# Patient Record
Sex: Female | Born: 1955 | Race: White | Hispanic: No | Marital: Married | Smoking: Never smoker
Health system: Southern US, Community
[De-identification: ages and names within clinical notes are randomized; demographics above are authoritative.]

---

## 1997-10-11 ENCOUNTER — Ambulatory Visit (HOSPITAL_COMMUNITY): Admission: RE | Admit: 1997-10-11 | Discharge: 1997-10-11 | Payer: Self-pay | Admitting: *Deleted

## 1997-10-11 ENCOUNTER — Emergency Department (HOSPITAL_COMMUNITY): Admission: EM | Admit: 1997-10-11 | Discharge: 1997-10-11 | Payer: Self-pay | Admitting: Emergency Medicine

## 1997-10-30 ENCOUNTER — Ambulatory Visit (HOSPITAL_COMMUNITY): Admission: RE | Admit: 1997-10-30 | Discharge: 1997-10-30 | Payer: Self-pay | Admitting: Neurological Surgery

## 1997-11-01 ENCOUNTER — Encounter: Admission: RE | Admit: 1997-11-01 | Discharge: 1998-01-30 | Payer: Self-pay | Admitting: Neurological Surgery

## 1998-02-28 ENCOUNTER — Encounter: Admission: RE | Admit: 1998-02-28 | Discharge: 1998-05-29 | Payer: Self-pay

## 1999-01-03 ENCOUNTER — Other Ambulatory Visit: Admission: RE | Admit: 1999-01-03 | Discharge: 1999-01-03 | Payer: Self-pay | Admitting: *Deleted

## 1999-10-06 ENCOUNTER — Encounter: Admission: RE | Admit: 1999-10-06 | Discharge: 2000-01-04 | Payer: Self-pay | Admitting: *Deleted

## 1999-10-18 ENCOUNTER — Ambulatory Visit (HOSPITAL_COMMUNITY): Admission: RE | Admit: 1999-10-18 | Discharge: 1999-10-18 | Payer: Self-pay | Admitting: *Deleted

## 1999-10-18 ENCOUNTER — Encounter: Payer: Self-pay | Admitting: *Deleted

## 2002-11-24 ENCOUNTER — Encounter: Admission: RE | Admit: 2002-11-24 | Discharge: 2002-11-24 | Payer: Self-pay | Admitting: Family Medicine

## 2002-11-24 ENCOUNTER — Encounter: Payer: Self-pay | Admitting: Family Medicine

## 2002-11-29 ENCOUNTER — Encounter: Admission: RE | Admit: 2002-11-29 | Discharge: 2002-11-29 | Payer: Self-pay | Admitting: Family Medicine

## 2002-11-29 ENCOUNTER — Encounter: Payer: Self-pay | Admitting: Family Medicine

## 2006-08-02 ENCOUNTER — Encounter (INDEPENDENT_AMBULATORY_CARE_PROVIDER_SITE_OTHER): Payer: Self-pay | Admitting: Cardiology

## 2006-08-02 ENCOUNTER — Ambulatory Visit (HOSPITAL_COMMUNITY): Admission: RE | Admit: 2006-08-02 | Discharge: 2006-08-02 | Payer: Self-pay | Admitting: Cardiology

## 2006-08-03 ENCOUNTER — Ambulatory Visit (HOSPITAL_COMMUNITY): Admission: RE | Admit: 2006-08-03 | Discharge: 2006-08-03 | Payer: Self-pay | Admitting: Cardiology

## 2007-01-06 ENCOUNTER — Other Ambulatory Visit: Admission: RE | Admit: 2007-01-06 | Discharge: 2007-01-06 | Payer: Self-pay | Admitting: Family Medicine

## 2007-01-25 ENCOUNTER — Encounter: Admission: RE | Admit: 2007-01-25 | Discharge: 2007-01-25 | Payer: Self-pay | Admitting: Family Medicine

## 2007-02-03 ENCOUNTER — Encounter: Admission: RE | Admit: 2007-02-03 | Discharge: 2007-02-03 | Payer: Self-pay | Admitting: Family Medicine

## 2007-08-26 ENCOUNTER — Emergency Department (HOSPITAL_COMMUNITY): Admission: EM | Admit: 2007-08-26 | Discharge: 2007-08-26 | Payer: Self-pay | Admitting: Emergency Medicine

## 2007-11-04 ENCOUNTER — Encounter: Admission: RE | Admit: 2007-11-04 | Discharge: 2007-11-04 | Payer: Self-pay | Admitting: Orthopedic Surgery

## 2008-02-22 ENCOUNTER — Encounter: Admission: RE | Admit: 2008-02-22 | Discharge: 2008-02-22 | Payer: Self-pay | Admitting: Family Medicine

## 2008-06-06 ENCOUNTER — Other Ambulatory Visit: Admission: RE | Admit: 2008-06-06 | Discharge: 2008-06-06 | Payer: Self-pay | Admitting: Family Medicine

## 2009-04-22 ENCOUNTER — Emergency Department (HOSPITAL_COMMUNITY): Admission: EM | Admit: 2009-04-22 | Discharge: 2009-04-22 | Payer: Self-pay | Admitting: Emergency Medicine

## 2010-05-29 ENCOUNTER — Encounter
Admission: RE | Admit: 2010-05-29 | Discharge: 2010-05-29 | Payer: Self-pay | Source: Home / Self Care | Admitting: Obstetrics and Gynecology

## 2010-07-12 ENCOUNTER — Encounter: Payer: Self-pay | Admitting: Obstetrics and Gynecology

## 2010-07-13 ENCOUNTER — Encounter: Payer: Self-pay | Admitting: Family Medicine

## 2011-06-19 ENCOUNTER — Other Ambulatory Visit: Payer: Self-pay | Admitting: Obstetrics and Gynecology

## 2011-06-19 DIAGNOSIS — Z1231 Encounter for screening mammogram for malignant neoplasm of breast: Secondary | ICD-10-CM

## 2011-07-17 ENCOUNTER — Ambulatory Visit: Payer: Self-pay

## 2012-06-09 ENCOUNTER — Ambulatory Visit: Payer: Self-pay

## 2012-08-19 ENCOUNTER — Other Ambulatory Visit: Payer: Self-pay

## 2012-08-19 DIAGNOSIS — Z1231 Encounter for screening mammogram for malignant neoplasm of breast: Secondary | ICD-10-CM

## 2012-09-15 ENCOUNTER — Ambulatory Visit
Admission: RE | Admit: 2012-09-15 | Discharge: 2012-09-15 | Disposition: A | Discharge status: Home / Self Care | Payer: Commercial Indemnity | Source: Ambulatory Visit

## 2012-09-15 DIAGNOSIS — Z1231 Encounter for screening mammogram for malignant neoplasm of breast: Secondary | ICD-10-CM

## 2012-09-16 ENCOUNTER — Other Ambulatory Visit: Payer: Self-pay | Admitting: Family Medicine

## 2012-09-16 DIAGNOSIS — R928 Other abnormal and inconclusive findings on diagnostic imaging of breast: Secondary | ICD-10-CM

## 2012-09-27 ENCOUNTER — Ambulatory Visit
Admission: RE | Admit: 2012-09-27 | Discharge: 2012-09-27 | Disposition: A | Discharge status: Home / Self Care | Payer: Managed Care, Other (non HMO) | Source: Ambulatory Visit | Attending: Family Medicine | Admitting: Family Medicine

## 2012-09-27 DIAGNOSIS — R928 Other abnormal and inconclusive findings on diagnostic imaging of breast: Secondary | ICD-10-CM

## 2014-03-06 ENCOUNTER — Emergency Department (HOSPITAL_BASED_OUTPATIENT_CLINIC_OR_DEPARTMENT_OTHER): Payer: BC Managed Care – PPO

## 2014-03-06 ENCOUNTER — Emergency Department (HOSPITAL_BASED_OUTPATIENT_CLINIC_OR_DEPARTMENT_OTHER)
Admission: EM | Admit: 2014-03-06 | Discharge: 2014-03-06 | Disposition: A | Discharge status: Home / Self Care | Payer: BC Managed Care – PPO | Attending: Emergency Medicine | Admitting: Emergency Medicine

## 2014-03-06 ENCOUNTER — Encounter (HOSPITAL_BASED_OUTPATIENT_CLINIC_OR_DEPARTMENT_OTHER): Payer: Self-pay | Admitting: Emergency Medicine

## 2014-03-06 DIAGNOSIS — K5909 Other constipation: Secondary | ICD-10-CM

## 2014-03-06 DIAGNOSIS — R300 Dysuria: Secondary | ICD-10-CM | POA: Diagnosis not present

## 2014-03-06 DIAGNOSIS — R1110 Vomiting, unspecified: Secondary | ICD-10-CM | POA: Insufficient documentation

## 2014-03-06 DIAGNOSIS — R1084 Generalized abdominal pain: Secondary | ICD-10-CM | POA: Diagnosis present

## 2014-03-06 DIAGNOSIS — N390 Urinary tract infection, site not specified: Secondary | ICD-10-CM

## 2014-03-06 DIAGNOSIS — K5900 Constipation, unspecified: Secondary | ICD-10-CM | POA: Diagnosis not present

## 2014-03-06 LAB — CBC WITH DIFFERENTIAL/PLATELET
Basophils Absolute: 0 10*3/uL (ref 0.0–0.1)
Basophils Relative: 0 % (ref 0–1)
EOS ABS: 0.1 10*3/uL (ref 0.0–0.7)
EOS PCT: 2 % (ref 0–5)
HCT: 39.7 % (ref 36.0–46.0)
Hemoglobin: 13.7 g/dL (ref 12.0–15.0)
LYMPHS ABS: 1.1 10*3/uL (ref 0.7–4.0)
LYMPHS PCT: 17 % (ref 12–46)
MCH: 29.7 pg (ref 26.0–34.0)
MCHC: 34.5 g/dL (ref 30.0–36.0)
MCV: 86.1 fL (ref 78.0–100.0)
MONO ABS: 0.5 10*3/uL (ref 0.1–1.0)
MONOS PCT: 8 % (ref 3–12)
NEUTROS PCT: 73 % (ref 43–77)
Neutro Abs: 5.1 10*3/uL (ref 1.7–7.7)
PLATELETS: 209 10*3/uL (ref 150–400)
RBC: 4.61 MIL/uL (ref 3.87–5.11)
RDW: 12.5 % (ref 11.5–15.5)
WBC: 6.9 10*3/uL (ref 4.0–10.5)

## 2014-03-06 LAB — URINALYSIS, ROUTINE W REFLEX MICROSCOPIC
BILIRUBIN URINE: NEGATIVE
Glucose, UA: NEGATIVE mg/dL
HGB URINE DIPSTICK: NEGATIVE
Ketones, ur: 15 mg/dL — AB
NITRITE: NEGATIVE
PH: 6.5 (ref 5.0–8.0)
PROTEIN: NEGATIVE mg/dL
Specific Gravity, Urine: 1.02 (ref 1.005–1.030)
Urobilinogen, UA: 0.2 mg/dL (ref 0.0–1.0)

## 2014-03-06 LAB — COMPREHENSIVE METABOLIC PANEL
ALK PHOS: 91 U/L (ref 39–117)
ALT: 28 U/L (ref 0–35)
ANION GAP: 10 (ref 5–15)
AST: 24 U/L (ref 0–37)
Albumin: 4.1 g/dL (ref 3.5–5.2)
BUN: 15 mg/dL (ref 6–23)
CALCIUM: 10.3 mg/dL (ref 8.4–10.5)
CO2: 29 mEq/L (ref 19–32)
Chloride: 101 mEq/L (ref 96–112)
Creatinine, Ser: 1 mg/dL (ref 0.50–1.10)
GFR calc Af Amer: 72 mL/min — ABNORMAL LOW (ref >90)
GFR calc non Af Amer: 62 mL/min — ABNORMAL LOW (ref >90)
Glucose, Bld: 104 mg/dL — ABNORMAL HIGH (ref 70–99)
Potassium: 3.8 mEq/L (ref 3.7–5.3)
SODIUM: 140 meq/L (ref 137–147)
Total Bilirubin: 1 mg/dL (ref 0.3–1.2)
Total Protein: 7.5 g/dL (ref 6.0–8.3)

## 2014-03-06 LAB — URINE MICROSCOPIC-ADD ON

## 2014-03-06 LAB — LIPASE, BLOOD: Lipase: 21 U/L (ref 11–59)

## 2014-03-06 MED ORDER — NITROFURANTOIN MONOHYD MACRO 100 MG PO CAPS
100.0000 mg | ORAL_CAPSULE | Freq: Once | ORAL | Status: AC
  Administered 2014-03-06: 100 mg via ORAL
  Filled 2014-03-06: qty 1

## 2014-03-06 MED ORDER — ONDANSETRON HCL 4 MG/2ML IJ SOLN
4.0000 mg | Freq: Once | INTRAMUSCULAR | Status: AC
  Administered 2014-03-06: 4 mg via INTRAVENOUS
  Filled 2014-03-06: qty 2

## 2014-03-06 MED ORDER — NITROFURANTOIN MONOHYD MACRO 100 MG PO CAPS: 100.0000 mg | ORAL_CAPSULE | Freq: Two times a day (BID) | ORAL | Status: AC

## 2014-03-06 MED ORDER — KETOROLAC TROMETHAMINE 30 MG/ML IJ SOLN
30.0000 mg | Freq: Once | INTRAMUSCULAR | Status: AC
  Administered 2014-03-06: 30 mg via INTRAVENOUS
  Filled 2014-03-06: qty 1

## 2014-03-06 MED ORDER — GI COCKTAIL ~~LOC~~
30.0000 mL | Freq: Once | ORAL | Status: DC
  Filled 2014-03-06: qty 30

## 2014-03-06 MED ORDER — ONDANSETRON 8 MG PO TBDP
8.0000 mg | ORAL_TABLET | Freq: Once | ORAL | Status: AC
  Administered 2014-03-06: 8 mg via ORAL
  Filled 2014-03-06: qty 1

## 2014-03-06 MED ORDER — DICYCLOMINE HCL 10 MG/ML IM SOLN
20.0000 mg | Freq: Once | INTRAMUSCULAR | Status: AC
  Administered 2014-03-06: 20 mg via INTRAMUSCULAR
  Filled 2014-03-06: qty 2

## 2014-03-06 NOTE — ED Notes (Signed)
Pt reports intermittent abdominal pain and pressure, vomited x 1, denies sob, dizziness, or back pain

## 2014-03-06 NOTE — ED Notes (Signed)
Patient upon d/c was slightly nauseated after sitting up too fast. MD aware  - orderes received and carried out. The patient felt better after interventions.

## 2014-03-06 NOTE — ED Provider Notes (Addendum)
CSN: [id number]     Arrival date & time 03/06/14  0119 History   First MD Initiated Contact with Patient 03/06/14 0133     Chief Complaint  Patient presents with  . Abdominal Pain     (Consider location/radiation/quality/duration/timing/severity/associated sxs/prior Treatment) Patient is a 58 y.o. female presenting with abdominal pain. The history is provided by the patient.  Abdominal Pain Pain location:  Generalized Pain quality: pressure   Pain quality: not bloating and not sharp   Pain quality comment:  Passing gas Pain radiates to:  Does not radiate Pain severity:  Severe Onset quality:  Gradual Duration:  1 day Timing:  Constant Progression:  Unchanged Chronicity:  New Context: not retching   Relieved by:  Nothing Worsened by:  Nothing tried Ineffective treatments:  Vomiting Associated symptoms: constipation, dysuria and vomiting   Associated symptoms: no chest pain and no fever   Vomiting:    Quality:  Stomach contents   Number of occurrences:  1   Severity:  Mild   Timing:  Rare   Progression:  Resolved Risk factors: no recent hospitalization   Patient states symptoms started about 12 pm Monday and have been ongoing.  Last food intake was 1 pm.  Patient states she has normal daily BM but went out of town over the weekend and her stool has been "different" and lessened since.  Perhaps dysuria a week ago but not today.  Felt so bad she forced herself to vomit and noted a red streak in it. No fevers,  No trauma.  Patient then recollected eating [employer] crackers this evening.  Took an essential peppermint oil capsule without relief of symptoms.    History reviewed. No pertinent past medical history. History reviewed. No pertinent past surgical history. History reviewed. No pertinent family history. History  Substance Use Topics  . Smoking status: Never Smoker   . Smokeless tobacco: Not on file  . Alcohol Use: No   OB History   Grav Para Term Preterm Abortions TAB SAB  Ect Mult Living                 Review of Systems  Constitutional: Negative for fever.  Cardiovascular: Negative for chest pain.  Gastrointestinal: Positive for vomiting, abdominal pain and constipation.  Genitourinary: Positive for dysuria.       A week ago  All other systems reviewed and are negative.     Allergies  Review of patient's allergies indicates no known allergies.  Home Medications   Prior to Admission medications   Not on File   BP 137/94  Pulse 74  Temp(Src) 98.7 F (37.1 C) (Oral)  Resp 20  Ht  (1.753 m)  Wt 195 lb (88.451 kg)  BMI 28.78 kg/m2  SpO2 98% Physical Exam  Constitutional: She is oriented to person, place, and time. She appears well-developed and well-nourished. No distress.  HENT:  Head: Normocephalic and atraumatic.  Mouth/Throat: Oropharynx is clear and moist.  Eyes: Conjunctivae are normal. Pupils are equal, round, and reactive to light.  Neck: Normal range of motion. Neck supple.  Cardiovascular: Normal rate, regular rhythm and intact distal pulses.   Pulmonary/Chest: Effort normal and breath sounds normal. No respiratory distress. She has no wheezes. She has no rales. She exhibits no tenderness.  Abdominal: Soft. Bowel sounds are increased. There is no tenderness. There is no rigidity, no rebound, no guarding, no tenderness at McBurney's point and negative Murphy's sign.  Musculoskeletal: Normal range of motion.  Neurological: She is  alert and oriented to person, place, and time. She has normal reflexes.  Skin: Skin is warm and dry.  Psychiatric: She has a normal mood and affect.    ED Course  Procedures (including critical care time) Labs Review Labs Reviewed  URINALYSIS, ROUTINE W REFLEX MICROSCOPIC - Abnormal; Notable for the following:    Color, Urine AMBER (*)    APPearance CLOUDY (*)    Ketones, ur 15 (*)    Leukocytes, UA MODERATE (*)    All other components within normal limits  COMPREHENSIVE METABOLIC PANEL -  Abnormal; Notable for the following:    Glucose, Bld 104 (*)    GFR calc non Af Amer 62 (*)    GFR calc Af Amer 72 (*)    All other components within normal limits  URINE MICROSCOPIC-ADD ON - Abnormal; Notable for the following:    Squamous Epithelial / LPF MANY (*)    Bacteria, UA MANY (*)    All other components within normal limits  CBC WITH DIFFERENTIAL  LIPASE, BLOOD    Imaging Review No results found.   EKG Interpretation None      MDM   Final diagnoses:  None  Labs and Xray results reviewed with patient.    No signs of perforated ulcer.  Suspect forcefulness of vomiting as patient reports "willing herself to vomit" caused blood streak, this was not hematemesis.  Do not believe symptoms are consistent with ulcer or or biliary colic.  WBCs normal, no LFT or lipase abnormalities.  No Murphy's sign nor pain at McBurneys point.  Exam vitals and labs are benign and reassuring.  Symptoms are most consistent with cramping and gas from same.    Pain relieved post medication, patient refused GI cocktail as she was pain free.  Will treat for both UTI and constipation.  Take all antibiotics as directed and begin daily miralax therapy.  Follow up with your PMD for recheck later this week.  Return for worsening pain, vomiting, oral or rectal bleeding fevers weakness or any concerns.       Jasmine Awe, MD 03/06/14 [phone]

## 2015-07-12 IMAGING — CR DG ABDOMEN ACUTE W/ 1V CHEST
3 series · 3 of 3 positions shown · non-contrast
Comparison: Chest radiograph performed 08/03/2006

CLINICAL DATA: Intermittent abdominal pain and abdominal pressure.
Vomiting.

EXAM:
ACUTE ABDOMEN SERIES (ABDOMEN 2 VIEW & CHEST 1 VIEW)

[w chest pa]
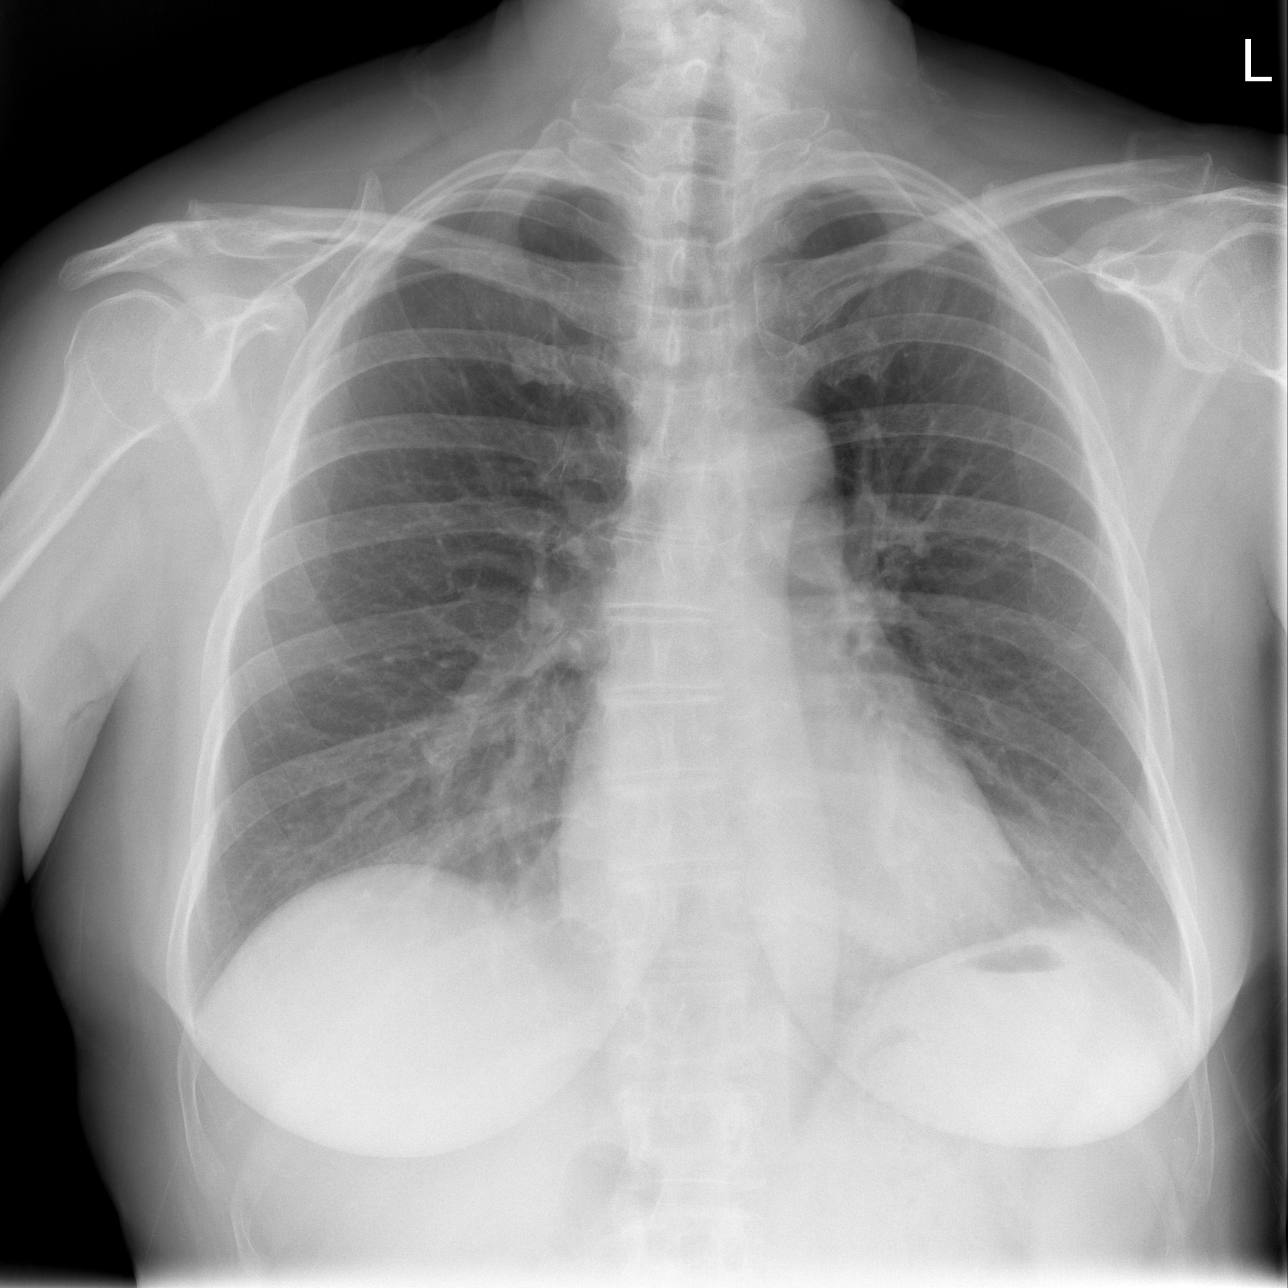

[w abdomen upright]
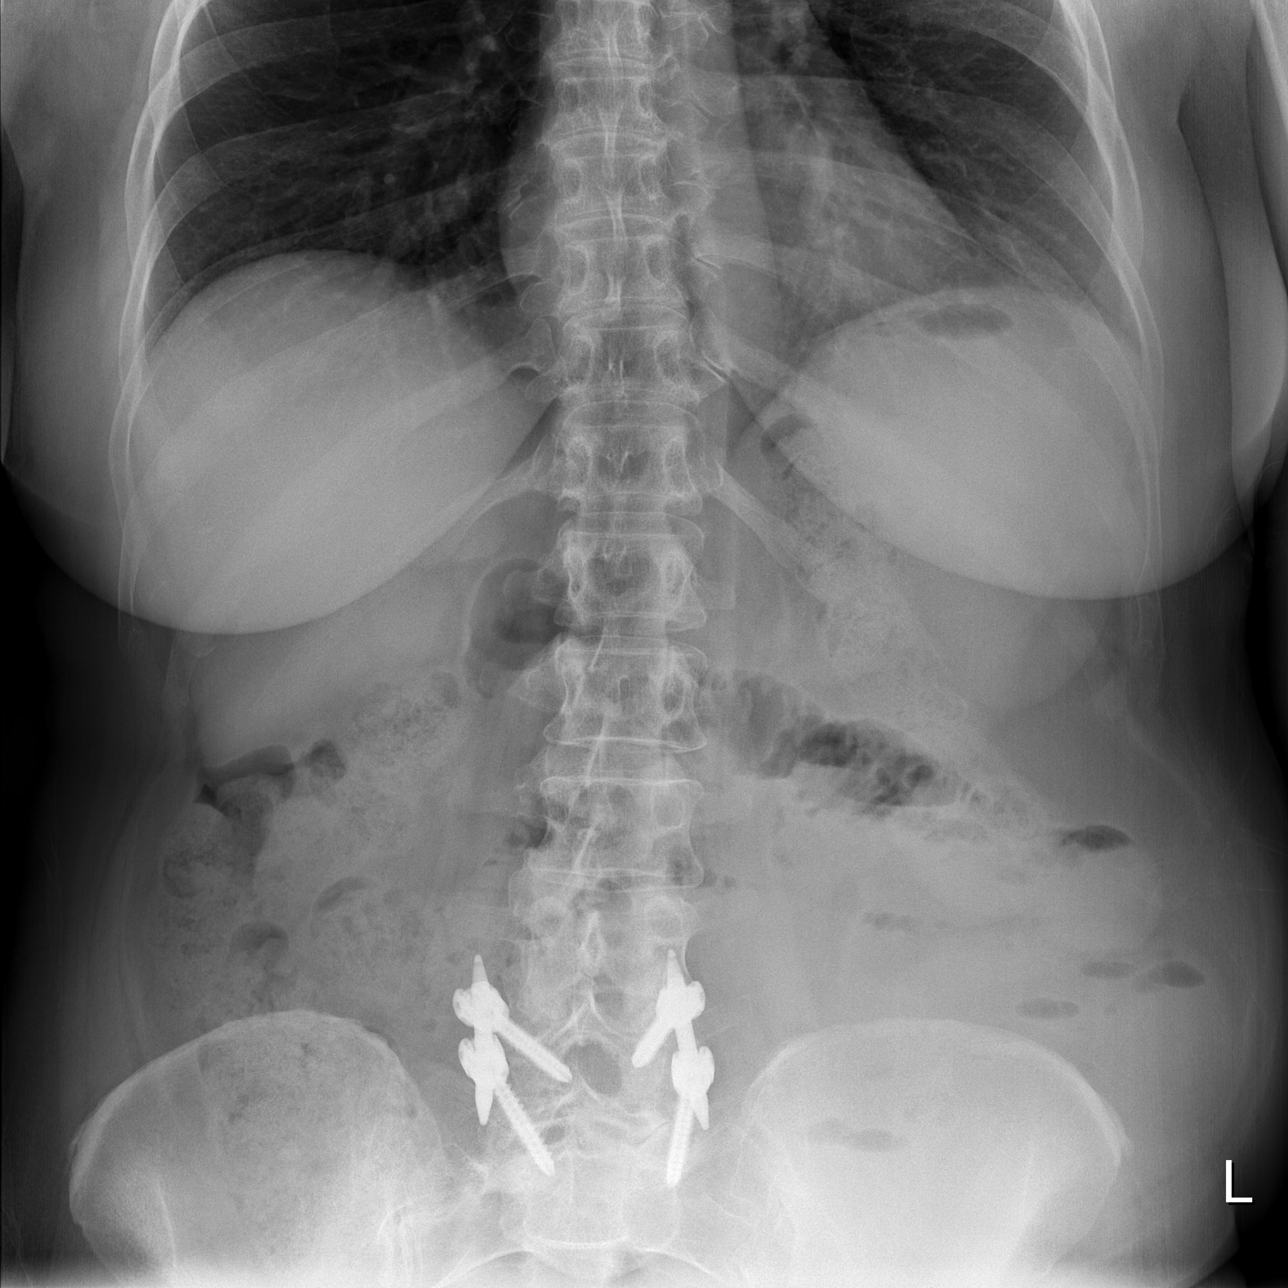

[t abdomen supine]
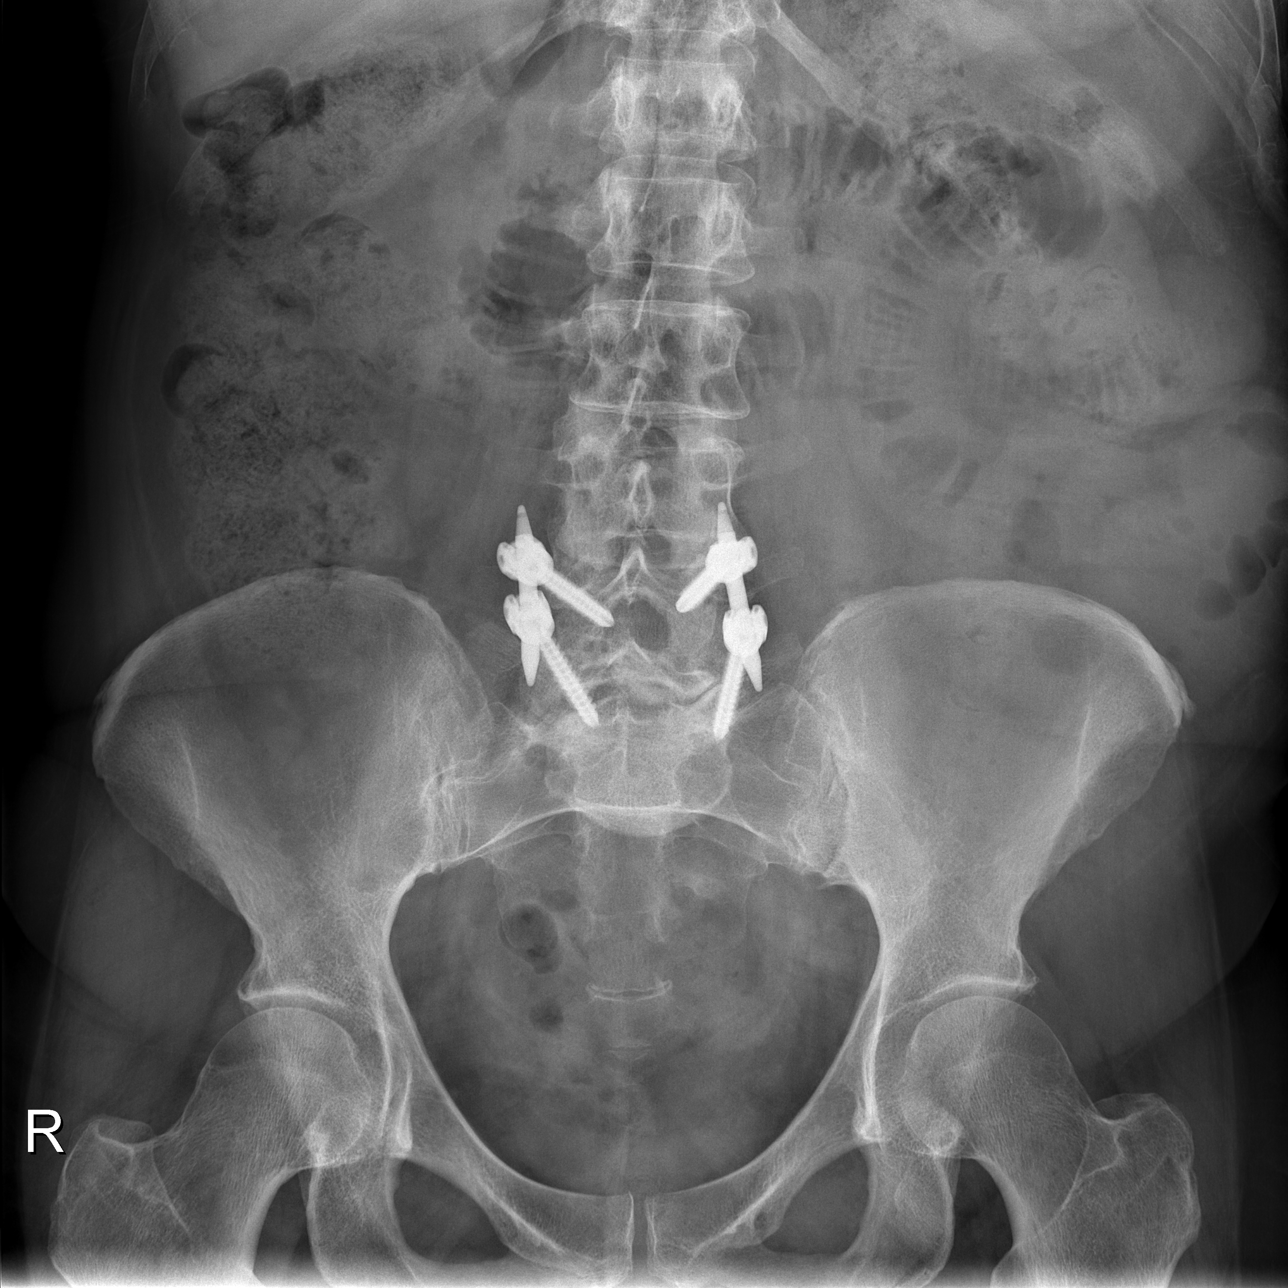

[3 of 3 positions shown; findings below may reference images not displayed]

FINDINGS: The lungs are well-aerated and clear. There is no evidence of focal
opacification, pleural effusion or pneumothorax. The
cardiomediastinal silhouette is within normal limits.

The visualized bowel gas pattern is unremarkable. Scattered stool
and air are seen within the colon; there is no evidence of small
bowel dilatation to suggest obstruction. No free intra-abdominal air
is identified on the provided upright view.

No acute osseous abnormalities are seen; the sacroiliac joints are
unremarkable in appearance. Lumbar spinal fusion hardware is noted
at L5-S1.
IMPRESSION: 1. Unremarkable bowel gas pattern; no free intra-abdominal air seen.
Small to moderate amount of stool noted in the colon.
2. No acute cardiopulmonary process identified.

## 2016-02-13 DIAGNOSIS — D2311 Other benign neoplasm of skin of right eyelid, including canthus: Secondary | ICD-10-CM | POA: Diagnosis not present

## 2016-06-05 DIAGNOSIS — E785 Hyperlipidemia, unspecified: Secondary | ICD-10-CM | POA: Diagnosis not present

## 2016-06-05 DIAGNOSIS — Z0000 Encounter for general adult medical examination without abnormal findings: Secondary | ICD-10-CM | POA: Diagnosis not present

## 2016-06-05 DIAGNOSIS — Z131 Encounter for screening for diabetes mellitus: Secondary | ICD-10-CM | POA: Diagnosis not present

## 2016-08-10 DIAGNOSIS — D2311 Other benign neoplasm of skin of right eyelid, including canthus: Secondary | ICD-10-CM | POA: Diagnosis not present

## 2017-06-11 DIAGNOSIS — Z0000 Encounter for general adult medical examination without abnormal findings: Secondary | ICD-10-CM | POA: Diagnosis not present

## 2017-06-11 DIAGNOSIS — Z131 Encounter for screening for diabetes mellitus: Secondary | ICD-10-CM | POA: Diagnosis not present

## 2017-06-11 DIAGNOSIS — E785 Hyperlipidemia, unspecified: Secondary | ICD-10-CM | POA: Diagnosis not present

## 2017-06-19 ENCOUNTER — Other Ambulatory Visit: Payer: Self-pay | Admitting: Physician Assistant

## 2017-06-19 DIAGNOSIS — Z1231 Encounter for screening mammogram for malignant neoplasm of breast: Secondary | ICD-10-CM

## 2018-06-17 DIAGNOSIS — E785 Hyperlipidemia, unspecified: Secondary | ICD-10-CM | POA: Diagnosis not present

## 2018-06-17 DIAGNOSIS — Z0000 Encounter for general adult medical examination without abnormal findings: Secondary | ICD-10-CM | POA: Diagnosis not present

## 2019-01-03 DIAGNOSIS — H00024 Hordeolum internum left upper eyelid: Secondary | ICD-10-CM | POA: Diagnosis not present

## 2019-01-03 DIAGNOSIS — H00021 Hordeolum internum right upper eyelid: Secondary | ICD-10-CM | POA: Diagnosis not present

## 2019-01-10 DIAGNOSIS — H00021 Hordeolum internum right upper eyelid: Secondary | ICD-10-CM | POA: Diagnosis not present

## 2019-06-05 ENCOUNTER — Other Ambulatory Visit: Payer: Self-pay | Admitting: Physician Assistant

## 2019-06-05 DIAGNOSIS — Z1231 Encounter for screening mammogram for malignant neoplasm of breast: Secondary | ICD-10-CM

## 2020-08-02 ENCOUNTER — Telehealth: Payer: Self-pay | Admitting: Specialist

## 2020-08-02 DIAGNOSIS — M25562 Pain in left knee: Secondary | ICD-10-CM | POA: Diagnosis not present

## 2020-08-02 NOTE — Telephone Encounter (Signed)
Received call from pt, wanting to check the date she had knee surgery with Dr. Otelia Sergeant. Looking is [facility], advised pt she had surgery on Right Knee 11/06/2008. That's all she needed. [phone]

## 2021-05-02 DIAGNOSIS — Z0001 Encounter for general adult medical examination with abnormal findings: Secondary | ICD-10-CM | POA: Diagnosis not present

## 2021-05-02 DIAGNOSIS — Z13220 Encounter for screening for lipoid disorders: Secondary | ICD-10-CM | POA: Diagnosis not present

## 2021-05-02 DIAGNOSIS — Z0000 Encounter for general adult medical examination without abnormal findings: Secondary | ICD-10-CM | POA: Diagnosis not present

## 2021-05-02 DIAGNOSIS — E559 Vitamin D deficiency, unspecified: Secondary | ICD-10-CM | POA: Diagnosis not present

## 2021-05-28 DIAGNOSIS — Z1211 Encounter for screening for malignant neoplasm of colon: Secondary | ICD-10-CM | POA: Diagnosis not present

## 2021-05-28 DIAGNOSIS — Z1212 Encounter for screening for malignant neoplasm of rectum: Secondary | ICD-10-CM | POA: Diagnosis not present

## 2022-03-31 DIAGNOSIS — J90 Pleural effusion, not elsewhere classified: Secondary | ICD-10-CM | POA: Diagnosis not present

## 2022-03-31 DIAGNOSIS — R5382 Chronic fatigue, unspecified: Secondary | ICD-10-CM | POA: Diagnosis not present

## 2022-03-31 DIAGNOSIS — J9811 Atelectasis: Secondary | ICD-10-CM | POA: Diagnosis not present

## 2022-03-31 DIAGNOSIS — R079 Chest pain, unspecified: Secondary | ICD-10-CM | POA: Diagnosis not present

## 2022-03-31 DIAGNOSIS — R0782 Intercostal pain: Secondary | ICD-10-CM | POA: Diagnosis not present

## 2022-04-02 DIAGNOSIS — J90 Pleural effusion, not elsewhere classified: Secondary | ICD-10-CM | POA: Diagnosis not present

## 2022-04-02 DIAGNOSIS — I2510 Atherosclerotic heart disease of native coronary artery without angina pectoris: Secondary | ICD-10-CM | POA: Diagnosis not present

## 2022-04-02 DIAGNOSIS — R0782 Intercostal pain: Secondary | ICD-10-CM | POA: Diagnosis not present

## 2022-04-03 DIAGNOSIS — J90 Pleural effusion, not elsewhere classified: Secondary | ICD-10-CM | POA: Diagnosis not present

## 2022-04-06 DIAGNOSIS — J90 Pleural effusion, not elsewhere classified: Secondary | ICD-10-CM | POA: Diagnosis not present

## 2022-04-06 DIAGNOSIS — R9389 Abnormal findings on diagnostic imaging of other specified body structures: Secondary | ICD-10-CM | POA: Diagnosis not present

## 2022-05-12 DIAGNOSIS — J90 Pleural effusion, not elsewhere classified: Secondary | ICD-10-CM | POA: Diagnosis not present

## 2022-05-12 DIAGNOSIS — J9811 Atelectasis: Secondary | ICD-10-CM | POA: Diagnosis not present

## 2022-05-12 DIAGNOSIS — R9389 Abnormal findings on diagnostic imaging of other specified body structures: Secondary | ICD-10-CM | POA: Diagnosis not present

## 2022-12-03 ENCOUNTER — Telehealth: Payer: Self-pay

## 2022-12-03 NOTE — Telephone Encounter (Signed)
Spoke to patient appointment scheduled with Dr.Schumann 12/11/22 at 9:20 am.She will have PCP fax EKG and office note done today.Directions given.

## 2022-12-11 ENCOUNTER — Encounter: Payer: Self-pay | Admitting: Cardiology

## 2022-12-11 ENCOUNTER — Ambulatory Visit: Payer: Medicare Other | Attending: Cardiology | Admitting: Cardiology

## 2022-12-11 VITALS — BP 115/72 | HR 66 | Ht 69.0 in | Wt 206.6 lb

## 2022-12-11 DIAGNOSIS — Z01818 Encounter for other preprocedural examination: Secondary | ICD-10-CM | POA: Insufficient documentation

## 2022-12-11 DIAGNOSIS — E669 Obesity, unspecified: Secondary | ICD-10-CM

## 2022-12-11 DIAGNOSIS — R9431 Abnormal electrocardiogram [ECG] [EKG]: Secondary | ICD-10-CM | POA: Diagnosis not present

## 2022-12-11 DIAGNOSIS — Z8249 Family history of ischemic heart disease and other diseases of the circulatory system: Secondary | ICD-10-CM | POA: Insufficient documentation

## 2022-12-11 DIAGNOSIS — Z13220 Encounter for screening for lipoid disorders: Secondary | ICD-10-CM | POA: Insufficient documentation

## 2022-12-11 NOTE — Progress Notes (Unsigned)
Cardiology Office Note:    Date:  12/14/2022   ID:  Alyssa Marshall, DOB Jul 16, 1955, MRN [id number]  PCP:  Pcp, No  Cardiologist:  Little Ishikawa, MD  Electrophysiologist:  None   Referring MD: Emily Filbert, MD   Chief Complaint  Patient presents with   Pre-op Exam    History of Present Illness:    Alyssa Marshall is a 67 y.o. female with a hx of hyperlipidemia who is referred by Dr. Mayford Knife for evaluation of abnormal EKG and preoperative evaluation.  Planning knee surgery.  Underwent EKG that showed left bundle branch block.  She denies any chest pain or shortness of breath.  States that she does not exercise because of knee pain.  She can walk up flight of stairs without stopping, denies any exertional symptoms.  She denies any recent lightheadedness, syncope, lower extremity edema, or palpitations.  She does have prior history of palpitations and had syncopal episodes though none in almost 20 years.  Previously had cardiac workup which she reports was negative.  She smoked briefly as a teenager.  Family history includes father had CABG in 39s and brother had CABG in 55s.   No past medical history on file.  No past surgical history on file.  Current Medications: No outpatient medications have been marked as taking for the 12/11/22 encounter (Office Visit) with Little Ishikawa, MD.     Allergies:   Patient has no known allergies.   Social History   Socioeconomic History   Marital status: Married    Spouse name: Not on file   Number of children: Not on file   Years of education: Not on file   Highest education level: Not on file  Occupational History   Not on file  Tobacco Use   Smoking status: Never   Smokeless tobacco: Not on file  Substance and Sexual Activity   Alcohol use: No   Drug use: Not on file   Sexual activity: Not on file  Other Topics Concern   Not on file  Social History Narrative   Not on file   Social Determinants of Health    Financial Resource Strain: Not on file  Food Insecurity: Not on file  Transportation Needs: Not on file  Physical Activity: Not on file  Stress: Not on file  Social Connections: Not on file     Family History: Family history includes father had CABG in 46s and brother had CABG in 35s.  ROS:   Please see the history of present illness.     All other systems reviewed and are negative.  EKGs/Labs/Other Studies Reviewed:    The following studies were reviewed today:   EKG:   12/11/2022: Normal sinus rhythm, low voltage, rate 66, less than 1 mm ST depression in inferior leads and V3-6 along with T wave inversions. 12/03/2022: Left bundle branch block, rate 65, normal sinus rhythm  Recent Labs: No results found for requested labs within last 365 days.  Recent Lipid Panel No results found for: "CHOL", "TRIG", "HDL", "CHOLHDL", "VLDL", "LDLCALC", "LDLDIRECT"  Physical Exam:    VS:  BP 115/72   Pulse 66   Ht 5\' 9"  (1.753 m)   Wt 206 lb 9.6 oz (93.7 kg)   SpO2 98%   BMI 30.51 kg/m     Wt Readings from Last 3 Encounters:  12/11/22 206 lb 9.6 oz (93.7 kg)  03/06/14 195 lb (88.5 kg)     GEN:  Well nourished, well  developed in no acute distress HEENT: Normal NECK: No JVD; No carotid bruits LYMPHATICS: No lymphadenopathy CARDIAC: RRR, no murmurs, rubs, gallops RESPIRATORY:  Clear to auscultation without rales, wheezing or rhonchi  ABDOMEN: Soft, non-tender, non-distended MUSCULOSKELETAL:  No edema; No deformity  SKIN: Warm and dry NEUROLOGIC:  Alert and oriented x 3 PSYCHIATRIC:  Normal affect   ASSESSMENT:    1. Pre-op evaluation   2. Family history of cardiovascular disease   3. Abnormal EKG   4. Lipid screening    PLAN:    Abnormal EKG: Left bundle branch block on EKG 12/03/2022.  Not present on EKG today.  Check echocardiogram to evaluate for structural heart disease  Preop evaluation: Prior to knee surgery.  Functional capacity has been limited due to knee  pain, but appears greater than 4 METS and she denies any exertional symptoms.  RCRI score 0.  Overall classify as low risk for an intermediate risk surgery.  Planning echocardiogram given abnormal EKG as above.  If echo unremarkable, no further cardiac workup recommended  Family history of CAD: Both father and brother underwent CABG.  Check calcium score.  Check lipid panel  RTC in 6 months   Medication Adjustments/Labs and Tests Ordered: Current medicines are reviewed at length with the patient today.  Concerns regarding medicines are outlined above.  Orders Placed This Encounter  Procedures   CT CARDIAC SCORING (SELF PAY ONLY)   Lipid panel   EKG 12-Lead   ECHOCARDIOGRAM COMPLETE   No orders of the defined types were placed in this encounter.   Patient Instructions  Medication Instructions:   No changes  *If you need a refill on your cardiac medications before your next appointment, please call your pharmacy*   Lab Work: fasting Lipid  If you have labs (blood work) drawn today and your tests are completely normal, you will receive your results only by: MyChart Message (if you have MyChart) OR A paper copy in the mail If you have any lab test that is abnormal or we need to change your treatment, we will call you to review the results.   Testing/Procedures: Will be schedule at [address] Your physician has requested that you have an echocardiogram. Echocardiography is a painless test that uses sound waves to create images of your heart. It provides your doctor with information about the size and shape of your heart and how well your heart's chambers and valves are working. This procedure takes approximately one hour. There are no restrictions for this procedure. Please do NOT wear cologne, perfume, aftershave, or lotions (deodorant is allowed). Please arrive 15 minutes prior to your appointment time.  And  CT coronary calcium score.   Test  locations:  [facility]  [facility] at [facility]  This is $99 out of pocket.   Coronary CalciumScan A coronary calcium scan is an imaging test used to look for deposits of calcium and other fatty materials (plaques) in the inner lining of the blood vessels of the heart (coronary arteries). These deposits of calcium and plaques can partly clog and narrow the coronary arteries without producing any symptoms or warning signs. This puts a person at risk for a heart attack. This test can detect these deposits before symptoms develop. Tell a health care provider about: Any allergies you have. All medicines you are taking, including vitamins, herbs, eye drops, creams, and over-the-counter medicines. Any problems you or family members  have had with anesthetic medicines. Any blood disorders you have. Any surgeries you have had. Any medical conditions you have. Whether you are pregnant or may be pregnant. What are the risks? Generally, this is a safe procedure. However, problems may occur, including: Harm to a pregnant woman and her unborn baby. This test involves the use of radiation. Radiation exposure can be dangerous to a pregnant woman and her unborn baby. If you are pregnant, you generally should not have this procedure done. Slight increase in the risk of cancer. This is because of the radiation involved in the test. What happens before the procedure? No preparation is needed for this procedure. What happens during the procedure? You will undress and remove any jewelry around your neck or chest. You will put on a hospital gown. Sticky electrodes will be placed on your chest. The electrodes will be connected to an electrocardiogram (ECG) machine to record a tracing of the electrical activity of your heart. A CT scanner will take pictures of your heart. During this time, you will be asked to lie still and hold your  breath for 2-3 seconds while a picture of your heart is being taken. The procedure may vary among health care providers and hospitals. What happens after the procedure? You can get dressed. You can return to your normal activities. It is up to you to get the results of your test. Ask your health care provider, or the department that is doing the test, when your results will be ready. Summary A coronary calcium scan is an imaging test used to look for deposits of calcium and other fatty materials (plaques) in the inner lining of the blood vessels of the heart (coronary arteries). Generally, this is a safe procedure. Tell your health care provider if you are pregnant or may be pregnant. No preparation is needed for this procedure. A CT scanner will take pictures of your heart. You can return to your normal activities after the scan is done. This information is not intended to replace advice given to you by your health care provider. Make sure you discuss any questions you have with your health care provider. Document Released: 12/05/2007 Document Revised: 04/27/2016 Document Reviewed: 04/27/2016 Elsevier Interactive Patient Education  2017 [employer].    Follow-Up: At [facility], you and your health needs are our priority.  As part of our continuing mission to provide you with exceptional heart care, we have created designated Provider Care Teams.  These Care Teams include your primary Cardiologist (physician) and Advanced Practice Providers (APPs -  Physician Assistants and Nurse Practitioners) who all work together to provide you with the care you need, when you need it.     Your next appointment:   6 month(s)  The format for your next appointment:   In Person  Provider:   Little Ishikawa, MD    Other Instructions    Signed, Little Ishikawa, MD  12/14/2022 6:29 AM    [facility]

## 2022-12-11 NOTE — Patient Instructions (Addendum)
Medication Instructions:   No changes  *If you need a refill on your cardiac medications before your next appointment, please call your pharmacy*   Lab Work: fasting Lipid  If you have labs (blood work) drawn today and your tests are completely normal, you will receive your results only by: MyChart Message (if you have MyChart) OR A paper copy in the mail If you have any lab test that is abnormal or we need to change your treatment, we will call you to review the results.   Testing/Procedures: Will be schedule at [address] Your physician has requested that you have an echocardiogram. Echocardiography is a painless test that uses sound waves to create images of your heart. It provides your doctor with information about the size and shape of your heart and how well your heart's chambers and valves are working. This procedure takes approximately one hour. There are no restrictions for this procedure. Please do NOT wear cologne, perfume, aftershave, or lotions (deodorant is allowed). Please arrive 15 minutes prior to your appointment time.  And  CT coronary calcium score.   Test locations:  [facility]  [facility] at [facility]  This is $99 out of pocket.   Coronary CalciumScan A coronary calcium scan is an imaging test used to look for deposits of calcium and other fatty materials (plaques) in the inner lining of the blood vessels of the heart (coronary arteries). These deposits of calcium and plaques can partly clog and narrow the coronary arteries without producing any symptoms or warning signs. This puts a person at risk for a heart attack. This test can detect these deposits before symptoms develop. Tell a health care provider about: Any allergies you have. All medicines you are taking, including vitamins, herbs, eye drops, creams, and over-the-counter medicines. Any  problems you or family members have had with anesthetic medicines. Any blood disorders you have. Any surgeries you have had. Any medical conditions you have. Whether you are pregnant or may be pregnant. What are the risks? Generally, this is a safe procedure. However, problems may occur, including: Harm to a pregnant woman and her unborn baby. This test involves the use of radiation. Radiation exposure can be dangerous to a pregnant woman and her unborn baby. If you are pregnant, you generally should not have this procedure done. Slight increase in the risk of cancer. This is because of the radiation involved in the test. What happens before the procedure? No preparation is needed for this procedure. What happens during the procedure? You will undress and remove any jewelry around your neck or chest. You will put on a hospital gown. Sticky electrodes will be placed on your chest. The electrodes will be connected to an electrocardiogram (ECG) machine to record a tracing of the electrical activity of your heart. A CT scanner will take pictures of your heart. During this time, you will be asked to lie still and hold your breath for 2-3 seconds while a picture of your heart is being taken. The procedure may vary among health care providers and hospitals. What happens after the procedure? You can get dressed. You can return to your normal activities. It is up to you to get the results of your test. Ask your health care provider, or the department that is doing the test, when your results will be ready. Summary A coronary calcium scan is an imaging test used to look for deposits  of calcium and other fatty materials (plaques) in the inner lining of the blood vessels of the heart (coronary arteries). Generally, this is a safe procedure. Tell your health care provider if you are pregnant or may be pregnant. No preparation is needed for this procedure. A CT scanner will take pictures of your  heart. You can return to your normal activities after the scan is done. This information is not intended to replace advice given to you by your health care provider. Make sure you discuss any questions you have with your health care provider. Document Released: 12/05/2007 Document Revised: 04/27/2016 Document Reviewed: 04/27/2016 Elsevier Interactive Patient Education  2017 [employer].    Follow-Up: At [facility], you and your health needs are our priority.  As part of our continuing mission to provide you with exceptional heart care, we have created designated Provider Care Teams.  These Care Teams include your primary Cardiologist (physician) and Advanced Practice Providers (APPs -  Physician Assistants and Nurse Practitioners) who all work together to provide you with the care you need, when you need it.     Your next appointment:   6 month(s)  The format for your next appointment:   In Person  Provider:   Little Ishikawa, MD    Other Instructions

## 2022-12-15 LAB — LIPID PANEL
Chol/HDL Ratio: 4.7 ratio — ABNORMAL HIGH (ref 0.0–4.4)
Cholesterol, Total: 276 mg/dL — ABNORMAL HIGH (ref 100–199)
HDL: 59 mg/dL (ref >39)
LDL Chol Calc (NIH): 192 mg/dL — ABNORMAL HIGH (ref 0–99)
Triglycerides: 136 mg/dL (ref 0–149)
VLDL Cholesterol Cal: 25 mg/dL (ref 5–40)

## 2022-12-18 ENCOUNTER — Ambulatory Visit (HOSPITAL_BASED_OUTPATIENT_CLINIC_OR_DEPARTMENT_OTHER)
Admission: RE | Admit: 2022-12-18 | Discharge: 2022-12-18 | Disposition: A | Discharge status: Home / Self Care | Payer: Medicare Other | Source: Ambulatory Visit | Attending: Cardiology | Admitting: Cardiology

## 2022-12-18 DIAGNOSIS — Z8249 Family history of ischemic heart disease and other diseases of the circulatory system: Secondary | ICD-10-CM | POA: Insufficient documentation

## 2022-12-18 DIAGNOSIS — Z01818 Encounter for other preprocedural examination: Secondary | ICD-10-CM | POA: Insufficient documentation

## 2022-12-18 DIAGNOSIS — R9431 Abnormal electrocardiogram [ECG] [EKG]: Secondary | ICD-10-CM | POA: Insufficient documentation

## 2022-12-22 ENCOUNTER — Telehealth: Payer: Self-pay | Admitting: Cardiology

## 2022-12-22 NOTE — Telephone Encounter (Signed)
? ?  Pt is calling to get calcium score result ?

## 2022-12-22 NOTE — Telephone Encounter (Signed)
Patient is aware of CT results 

## 2023-01-12 ENCOUNTER — Ambulatory Visit (HOSPITAL_BASED_OUTPATIENT_CLINIC_OR_DEPARTMENT_OTHER)
Admission: RE | Admit: 2023-01-12 | Discharge: 2023-01-12 | Disposition: A | Discharge status: Home / Self Care | Payer: Medicare Other | Source: Ambulatory Visit | Attending: Cardiology | Admitting: Cardiology

## 2023-01-12 DIAGNOSIS — I2510 Atherosclerotic heart disease of native coronary artery without angina pectoris: Secondary | ICD-10-CM | POA: Diagnosis not present

## 2023-01-12 DIAGNOSIS — R9431 Abnormal electrocardiogram [ECG] [EKG]: Secondary | ICD-10-CM | POA: Diagnosis not present

## 2023-01-12 DIAGNOSIS — Z8249 Family history of ischemic heart disease and other diseases of the circulatory system: Secondary | ICD-10-CM | POA: Insufficient documentation

## 2023-01-12 DIAGNOSIS — I517 Cardiomegaly: Secondary | ICD-10-CM | POA: Diagnosis not present

## 2023-01-12 DIAGNOSIS — Z01818 Encounter for other preprocedural examination: Secondary | ICD-10-CM | POA: Diagnosis not present

## 2023-01-12 LAB — ECHOCARDIOGRAM COMPLETE
Area-P 1/2: 3.91 cm2
S' Lateral: 3.1 cm

## 2023-06-10 ENCOUNTER — Ambulatory Visit: Payer: Self-pay | Admitting: Cardiology
